# Patient Record
Sex: Female | Born: 1985 | Hispanic: No | Marital: Married | Smoking: Never smoker
Health system: Southern US, Community
[De-identification: ages and names within clinical notes are randomized; demographics above are authoritative.]

---

## 2006-07-20 ENCOUNTER — Ambulatory Visit (HOSPITAL_COMMUNITY): Admission: RE | Admit: 2006-07-20 | Discharge: 2006-07-20 | Payer: Self-pay | Admitting: Obstetrics and Gynecology

## 2008-02-04 IMAGING — US US OB DETAIL+14 WK
1 series · 14 of 28 positions shown · non-contrast
Comparison: none

OBSTETRICAL ULTRASOUND:
 This ultrasound was performed in The [HOSPITAL], and the [facility] report will be stored to [REDACTED] PACS.

[Series 1: us ob detail+14 wk · 14 of 105 slices shown]
[im 4/105]
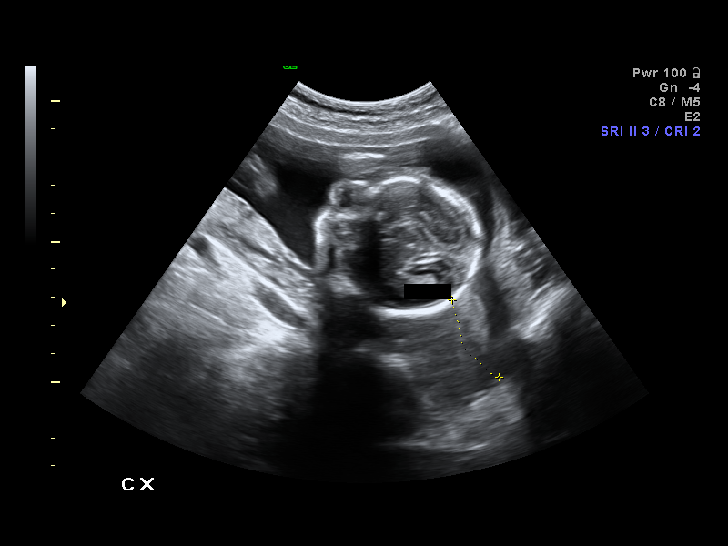
[im 12/105]
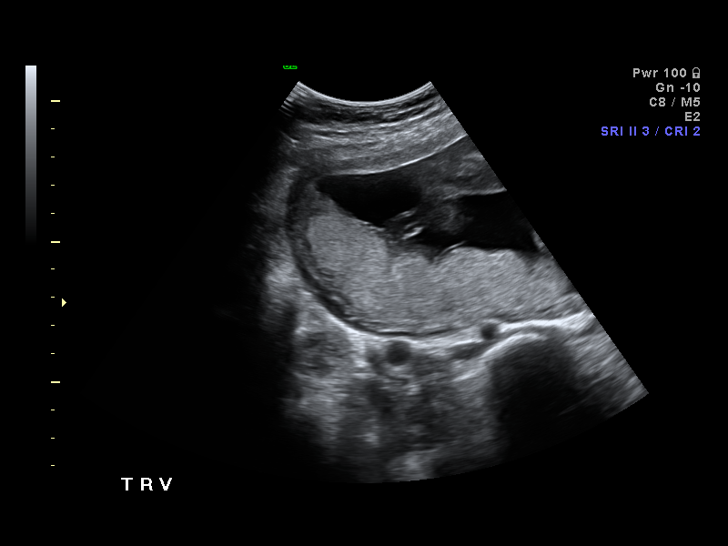
[im 20/105]
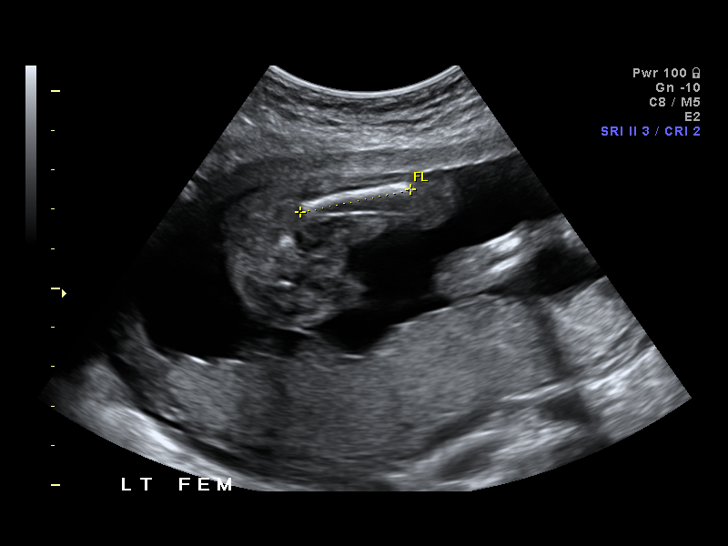
[im 27/105]
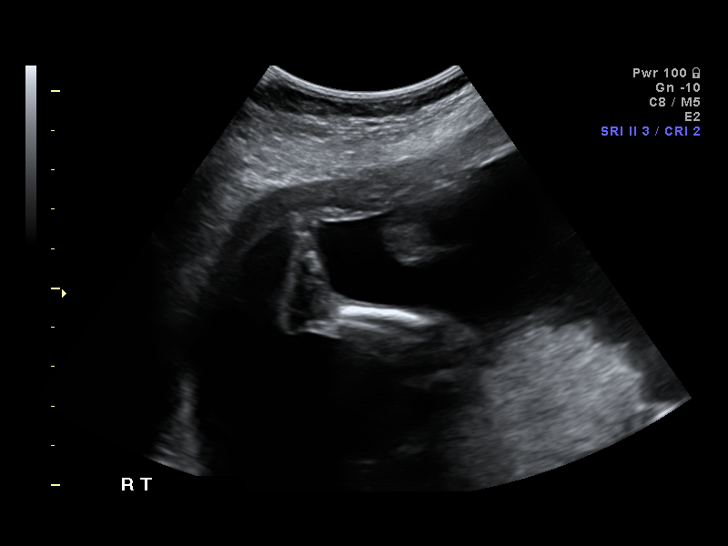
[im 35/105]
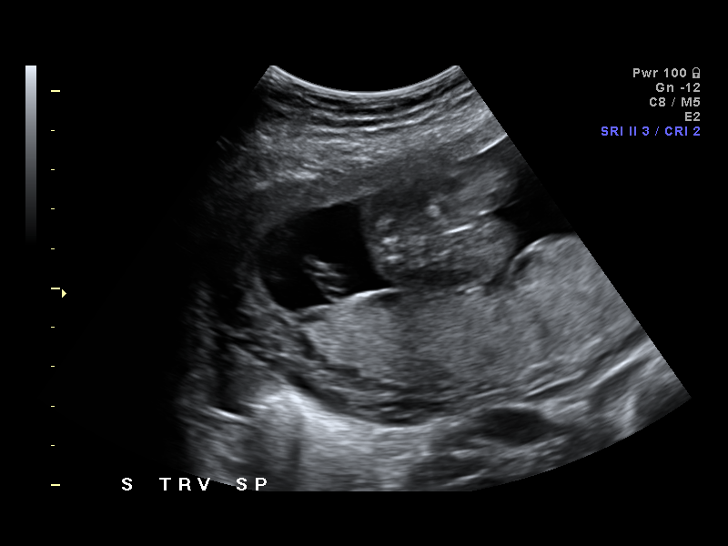
[im 43/105]
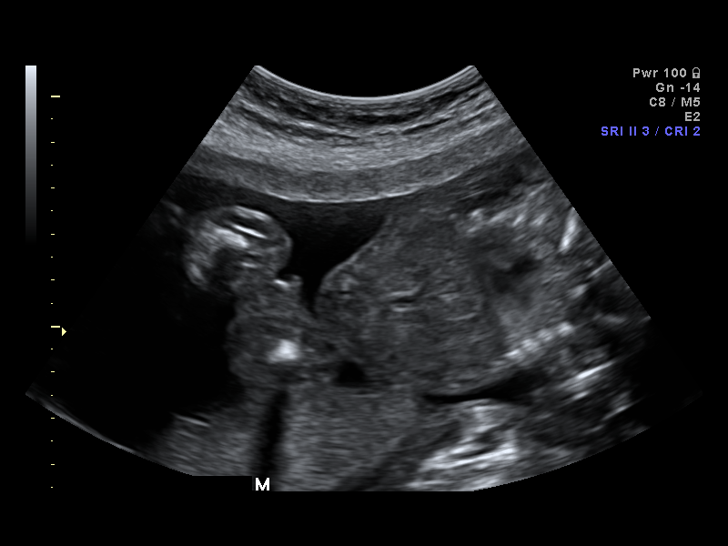
[im 51/105]
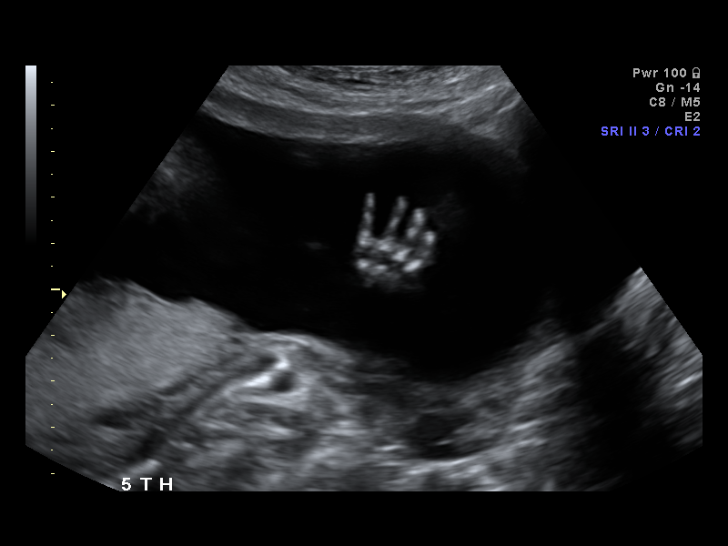
[im 58/105]
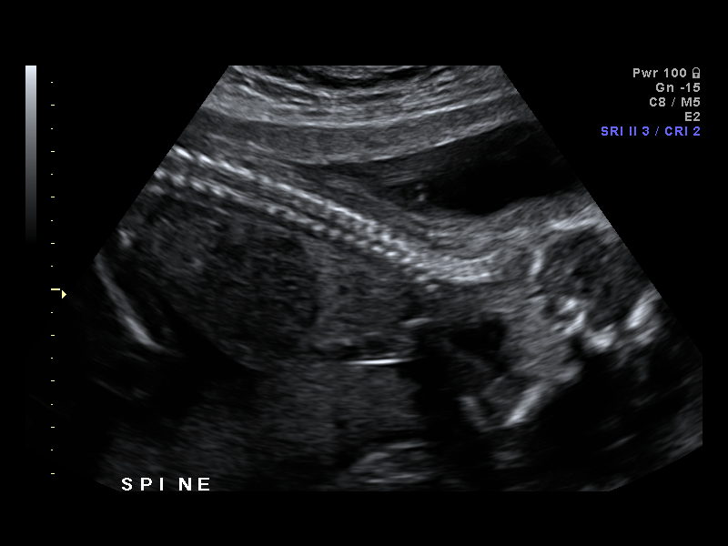
[im 66/105]
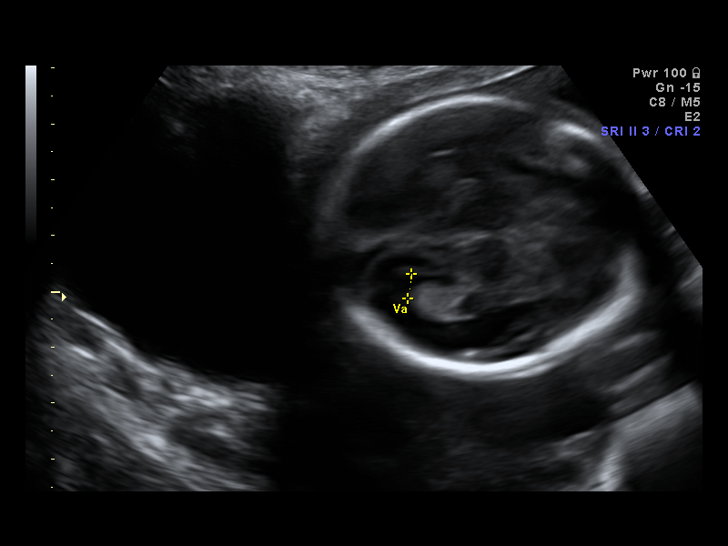
[im 74/105]
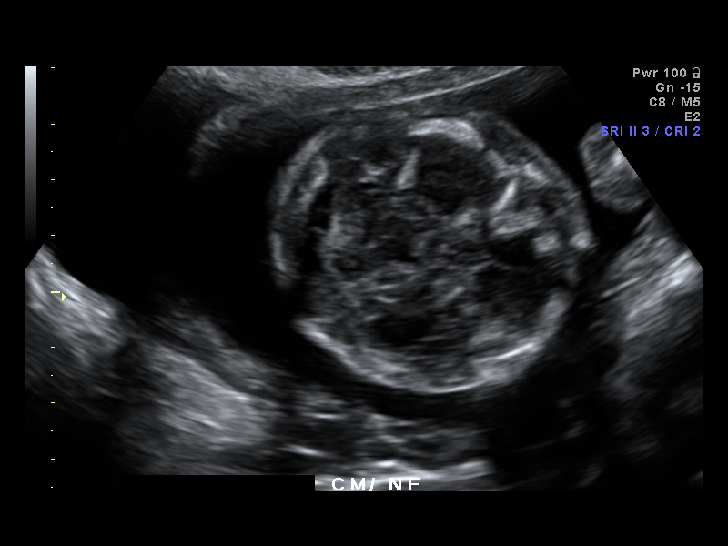
[im 81/105]
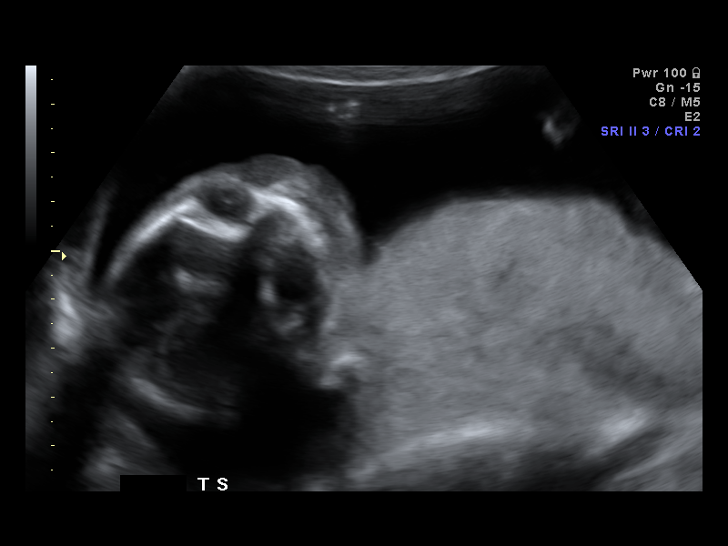
[im 89/105]
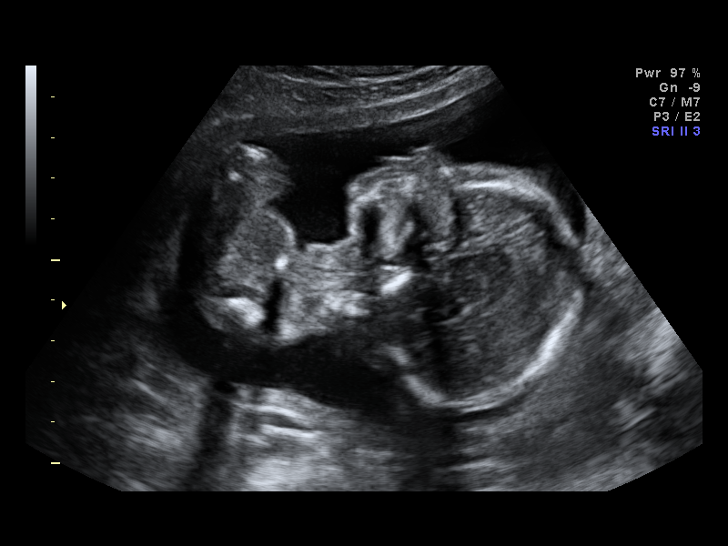
[im 97/105]
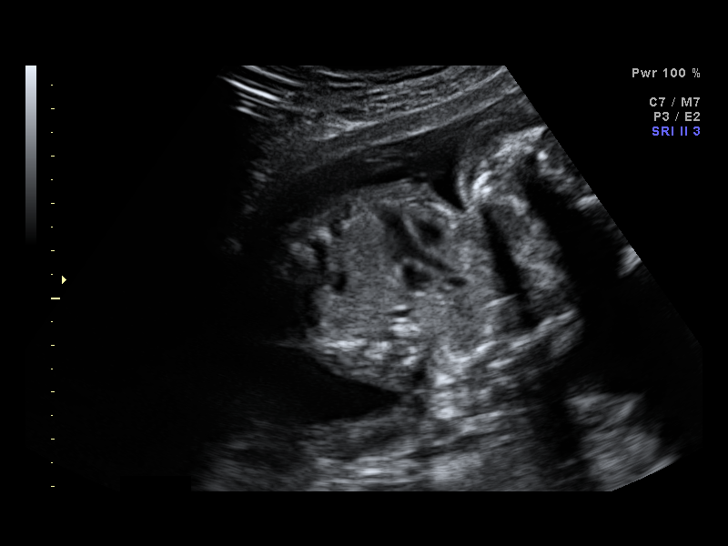
[im 105/105]
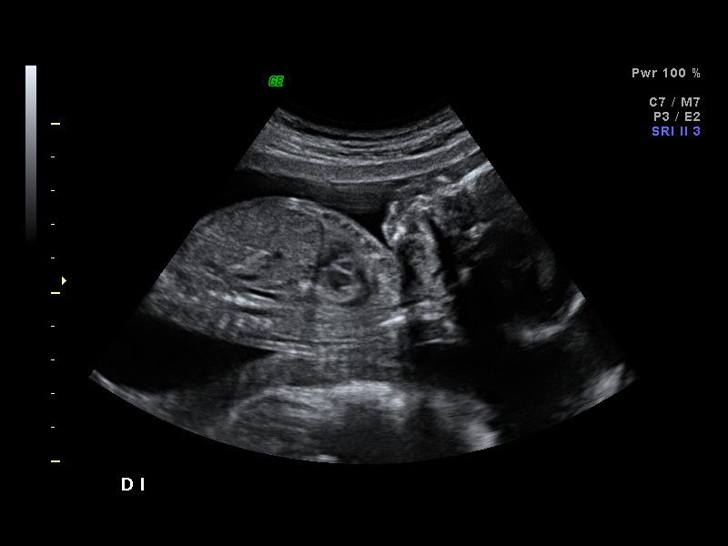

[14 of 28 positions shown; findings below may reference images not displayed]

IMPRESSION: The [facility] report has also been faxed to the ordering physician.

## 2017-03-20 ENCOUNTER — Emergency Department (HOSPITAL_BASED_OUTPATIENT_CLINIC_OR_DEPARTMENT_OTHER)
Admission: EM | Admit: 2017-03-20 | Discharge: 2017-03-20 | Disposition: A | Discharge status: Other Acute Inpatient Hospital | Payer: BLUE CROSS/BLUE SHIELD | Attending: Emergency Medicine | Admitting: Emergency Medicine

## 2017-03-20 ENCOUNTER — Emergency Department (HOSPITAL_BASED_OUTPATIENT_CLINIC_OR_DEPARTMENT_OTHER): Payer: BLUE CROSS/BLUE SHIELD

## 2017-03-20 ENCOUNTER — Encounter (HOSPITAL_BASED_OUTPATIENT_CLINIC_OR_DEPARTMENT_OTHER): Payer: Self-pay | Admitting: *Deleted

## 2017-03-20 DIAGNOSIS — R1030 Lower abdominal pain, unspecified: Secondary | ICD-10-CM | POA: Diagnosis present

## 2017-03-20 DIAGNOSIS — O009 Unspecified ectopic pregnancy without intrauterine pregnancy: Secondary | ICD-10-CM | POA: Diagnosis not present

## 2017-03-20 DIAGNOSIS — R102 Pelvic and perineal pain: Secondary | ICD-10-CM

## 2017-03-20 DIAGNOSIS — O26891 Other specified pregnancy related conditions, first trimester: Secondary | ICD-10-CM

## 2017-03-20 LAB — COMPREHENSIVE METABOLIC PANEL
ALBUMIN: 3.8 g/dL (ref 3.5–5.0)
ALK PHOS: 47 U/L (ref 38–126)
ALT: 9 U/L — AB (ref 14–54)
AST: 20 U/L (ref 15–41)
Anion gap: 9 (ref 5–15)
BUN: 9 mg/dL (ref 6–20)
CO2: 21 mmol/L — AB (ref 22–32)
Calcium: 7.8 mg/dL — ABNORMAL LOW (ref 8.9–10.3)
Chloride: 108 mmol/L (ref 101–111)
Creatinine, Ser: 0.62 mg/dL (ref 0.44–1.00)
GFR calc Af Amer: >60 mL/min (ref >60)
GFR calc non Af Amer: >60 mL/min (ref >60)
GLUCOSE: 231 mg/dL — AB (ref 65–99)
Potassium: 3.3 mmol/L — ABNORMAL LOW (ref 3.5–5.1)
SODIUM: 138 mmol/L (ref 135–145)
TOTAL PROTEIN: 6 g/dL — AB (ref 6.5–8.1)
Total Bilirubin: 0.5 mg/dL (ref 0.3–1.2)

## 2017-03-20 LAB — CBC WITH DIFFERENTIAL/PLATELET
BASOS ABS: 0 10*3/uL (ref 0.0–0.1)
Basophils Relative: 0 %
EOS PCT: 1 %
Eosinophils Absolute: 0.2 10*3/uL (ref 0.0–0.7)
HEMATOCRIT: 29.9 % — AB (ref 36.0–46.0)
Hemoglobin: 10 g/dL — ABNORMAL LOW (ref 12.0–15.0)
LYMPHS ABS: 2 10*3/uL (ref 0.7–4.0)
Lymphocytes Relative: 11 %
MCH: 29.8 pg (ref 26.0–34.0)
MCHC: 33.4 g/dL (ref 30.0–36.0)
MCV: 89 fL (ref 78.0–100.0)
MONOS PCT: 4 %
Monocytes Absolute: 0.7 10*3/uL (ref 0.1–1.0)
NEUTROS PCT: 84 %
Neutro Abs: 14.9 10*3/uL — ABNORMAL HIGH (ref 1.7–7.7)
PLATELETS: 161 10*3/uL (ref 150–400)
RBC: 3.36 MIL/uL — AB (ref 3.87–5.11)
RDW: 13.5 % (ref 11.5–15.5)
WBC: 17.8 10*3/uL — AB (ref 4.0–10.5)

## 2017-03-20 LAB — URINALYSIS, ROUTINE W REFLEX MICROSCOPIC
Bilirubin Urine: NEGATIVE
Glucose, UA: NEGATIVE mg/dL
Hgb urine dipstick: NEGATIVE
Ketones, ur: 15 mg/dL — AB
LEUKOCYTES UA: NEGATIVE
NITRITE: NEGATIVE
PH: 5 (ref 5.0–8.0)
Protein, ur: 30 mg/dL — AB
SPECIFIC GRAVITY, URINE: 1.021 (ref 1.005–1.030)

## 2017-03-20 LAB — URINALYSIS, MICROSCOPIC (REFLEX)

## 2017-03-20 LAB — PREGNANCY, URINE: Preg Test, Ur: POSITIVE — AB

## 2017-03-20 LAB — HCG, QUANTITATIVE, PREGNANCY: hCG, Beta Chain, Quant, S: 34531 m[IU]/mL — ABNORMAL HIGH (ref <5)

## 2017-03-20 MED ORDER — SODIUM CHLORIDE 0.9 % IV BOLUS (SEPSIS)
1000.0000 mL | Freq: Once | INTRAVENOUS | Status: AC
Start: 2017-03-20 — End: 2017-03-20
  Administered 2017-03-20: 1000 mL via INTRAVENOUS

## 2017-03-20 MED ORDER — MORPHINE SULFATE (PF) 4 MG/ML IV SOLN
4.0000 mg | Freq: Once | INTRAVENOUS | Status: AC
  Administered 2017-03-20: 4 mg via INTRAVENOUS
  Filled 2017-03-20: qty 1

## 2017-03-20 MED ORDER — SODIUM CHLORIDE 0.9 % IV SOLN: Freq: Once | INTRAVENOUS | Status: DC

## 2017-03-20 NOTE — ED Notes (Signed)
Consent obtained for blood transfusion. Dr. Judd Lienelo cancelled transfusion before initiated. Care link to bedside for transport. BP 108/78.

## 2017-03-20 NOTE — ED Notes (Signed)
Contacted Dr. Shawnie Ponsorn @ [facility] @ [phone]

## 2017-03-20 NOTE — ED Triage Notes (Signed)
Abdominal pain. Pale.

## 2017-03-20 NOTE — ED Provider Notes (Signed)
[facility]   CSN: [id number] Arrival date & time: 03/20/17  1722     History   Chief Complaint Chief Complaint  Patient presents with  . Abdominal Pain    HPI Kaitlin Reeves is a 31 y.o. female.  Patient is a 31 year old female with no significant past medical history. She presents for evaluation of lower abdominal pain. She reports the acute onset of severe suprapubic pain approximately 1 hour prior to presentation. She had similar pains a few days ago, however results spontaneously and were not quite this bad. She denies any fevers or chills. She denies any urinary or bowel complaints. She has an IUD in place and reports intermittent spotting at baseline, but denies any increased vaginal bleeding or discharge. She highly doubt the possibility of pregnancy.   The history is provided by the patient and the spouse.  Abdominal Pain   This is a new problem. The current episode started less than 1 hour ago. The problem occurs constantly. The problem has been rapidly worsening. The pain is associated with an unknown factor. The pain is located in the suprapubic region. The quality of the pain is cramping. The pain is severe. Pertinent negatives include fever, melena and dysuria. Nothing aggravates the symptoms. Nothing relieves the symptoms.    History reviewed. No pertinent past medical history.  There are no active problems to display for this patient.   History reviewed. No pertinent surgical history.  OB History    No data available       Home Medications    Prior to Admission medications   Not on File    Family History No family history on file.  Social History Social History  Substance Use Topics  . Smoking status: Never Smoker  . Smokeless tobacco: Never Used  . Alcohol use No     Allergies   Amoxicillin   Review of Systems Review of Systems  Constitutional: Negative for fever.  Gastrointestinal: Positive for abdominal pain.  Negative for melena.  Genitourinary: Negative for dysuria.  All other systems reviewed and are negative.    Physical Exam Updated Vital Signs BP 95/71   Pulse (!) 119   Temp 97.7 F (36.5 C) (Oral)   Resp (!) 28   Ht 5\' 1"  (1.549 m)   Wt 59 kg (130 lb)   LMP 03/19/2017   SpO2 100%   BMI 24.56 kg/m   Physical Exam  Constitutional: She is oriented to person, place, and time. She appears well-developed and well-nourished. No distress.  HENT:  Head: Normocephalic and atraumatic.  Neck: Normal range of motion. Neck supple.  Cardiovascular: Normal rate and regular rhythm.  Exam reveals no gallop and no friction rub.   No murmur heard. Pulmonary/Chest: Effort normal and breath sounds normal. No respiratory distress. She has no wheezes.  Abdominal: Soft. Bowel sounds are normal. She exhibits no distension. There is tenderness. There is no rebound and no guarding.  There is tenderness to palpation in the suprapubic region.  Musculoskeletal: Normal range of motion.  Neurological: She is alert and oriented to person, place, and time.  Skin: Skin is warm and dry. She is not diaphoretic.  Nursing note and vitals reviewed.    ED Treatments / Results  Labs (all labs ordered are listed, but only abnormal results are displayed) Labs Reviewed  URINALYSIS, ROUTINE W REFLEX MICROSCOPIC  PREGNANCY, URINE    EKG  EKG Interpretation None       Radiology No results found.  Procedures Procedures (including critical care time)  Medications Ordered in ED Medications - No data to display   Initial Impression / Assessment and Plan / ED Course  I have reviewed the triage vital signs and the nursing notes.  Pertinent labs & imaging results that were available during my care of the patient were reviewed by me and considered in my medical decision making (see chart for details).  Patient presents with sudden onset of suprapubic abdominal pain that started just prior to arrival. She  arrived here in significant discomfort, appeared pale, and presyncopal. She was immediately taken to her room where she was immediately evaluated by myself. Workup was initiated and IV fluids were started.  Her pregnancy test returned positive despite the presence of an IUD. Quantitative beta hCG is 34,000 and ultrasound reveals a ruptured ectopic pregnancy within the right fallopian tube. This finding was discussed with Dr. Rito Ehrlich'Keefe from GYN in [facility]. He is requesting the patient be sent to the ER where he can then take her to the operating room.  She did have an episode of hypotension with her blood pressure dropping to the 80s systolic. She was given boluses of saline. I had contemplated giving blood, however was informed that this would delay transport secondary to the patient requiring observation to rule out a transfusion reaction. After IV fluids, her blood pressure is now 108 systolic. I will forego the transfusion and continue saline as to not delay transport.  CRITICAL CARE Performed by: Geoffery LyonseLo, Carey Lafon Total critical care time: 45 minutes Critical care time was exclusive of separately billable procedures and treating other patients. Critical care was necessary to treat or prevent imminent or life-threatening deterioration. Critical care was time spent personally by me on the following activities: development of treatment plan with patient and/or surrogate as well as nursing, discussions with consultants, evaluation of patient's response to treatment, examination of patient, obtaining history from patient or surrogate, ordering and performing treatments and interventions, ordering and review of laboratory studies, ordering and review of radiographic studies, pulse oximetry and re-evaluation of patient's condition.   Final Clinical Impressions(s) / ED Diagnoses   Final diagnoses:  None    New Prescriptions New Prescriptions   No medications on file     Geoffery Lyonselo, Taelyr Jantz,  MD 03/20/17 2015

## 2017-03-21 LAB — TYPE AND SCREEN: UNIT DIVISION: 0

## 2017-03-21 LAB — BPAM RBC
BLOOD PRODUCT EXPIRATION DATE: 201809042359
ISSUE DATE / TIME: 201808141956
UNIT TYPE AND RH: 9500
